# Patient Record
Sex: Male | Born: 1955 | Race: Black or African American | Hispanic: No | Marital: Single | Smoking: Never smoker
Health system: Southern US, Community
[De-identification: ages and names within clinical notes are randomized; demographics above are authoritative.]

## PROBLEM LIST (undated history)

## (undated) DIAGNOSIS — I10 Essential (primary) hypertension: Secondary | ICD-10-CM

## (undated) HISTORY — PX: APPENDECTOMY: SHX54

---

## 2018-01-24 ENCOUNTER — Emergency Department (HOSPITAL_COMMUNITY): Payer: BLUE CROSS/BLUE SHIELD

## 2018-01-24 ENCOUNTER — Emergency Department (HOSPITAL_COMMUNITY)
Admission: EM | Admit: 2018-01-24 | Discharge: 2018-01-24 | Disposition: A | Discharge status: Home / Self Care | Payer: BLUE CROSS/BLUE SHIELD | Attending: Emergency Medicine | Admitting: Emergency Medicine

## 2018-01-24 ENCOUNTER — Encounter (HOSPITAL_COMMUNITY): Payer: Self-pay | Admitting: *Deleted

## 2018-01-24 DIAGNOSIS — Z79899 Other long term (current) drug therapy: Secondary | ICD-10-CM | POA: Diagnosis not present

## 2018-01-24 DIAGNOSIS — R0602 Shortness of breath: Secondary | ICD-10-CM | POA: Diagnosis not present

## 2018-01-24 DIAGNOSIS — R911 Solitary pulmonary nodule: Secondary | ICD-10-CM | POA: Insufficient documentation

## 2018-01-24 DIAGNOSIS — Z87891 Personal history of nicotine dependence: Secondary | ICD-10-CM | POA: Diagnosis not present

## 2018-01-24 DIAGNOSIS — I10 Essential (primary) hypertension: Secondary | ICD-10-CM | POA: Insufficient documentation

## 2018-01-24 DIAGNOSIS — R079 Chest pain, unspecified: Secondary | ICD-10-CM | POA: Diagnosis not present

## 2018-01-24 HISTORY — DX: Essential (primary) hypertension: I10

## 2018-01-24 LAB — CBC
HCT: 40.3 % (ref 39.0–52.0)
Hemoglobin: 13.6 g/dL (ref 13.0–17.0)
MCH: 27.7 pg (ref 26.0–34.0)
MCHC: 33.7 g/dL (ref 30.0–36.0)
MCV: 82.1 fL (ref 78.0–100.0)
PLATELETS: 193 10*3/uL (ref 150–400)
RBC: 4.91 MIL/uL (ref 4.22–5.81)
RDW: 15.7 % — AB (ref 11.5–15.5)
WBC: 3.4 10*3/uL — ABNORMAL LOW (ref 4.0–10.5)

## 2018-01-24 LAB — BASIC METABOLIC PANEL
ANION GAP: 8 (ref 5–15)
BUN: 13 mg/dL (ref 6–20)
CALCIUM: 8.3 mg/dL — AB (ref 8.9–10.3)
CHLORIDE: 110 mmol/L (ref 98–111)
CO2: 25 mmol/L (ref 22–32)
CREATININE: 1.12 mg/dL (ref 0.61–1.24)
GFR calc Af Amer: >60 mL/min (ref >60)
GFR calc non Af Amer: >60 mL/min (ref >60)
GLUCOSE: 100 mg/dL — AB (ref 70–99)
POTASSIUM: 3.9 mmol/L (ref 3.5–5.1)
Sodium: 143 mmol/L (ref 135–145)

## 2018-01-24 LAB — D-DIMER, QUANTITATIVE: D-Dimer, Quant: 1.65 ug/mL-FEU — ABNORMAL HIGH (ref 0.00–0.50)

## 2018-01-24 LAB — I-STAT TROPONIN, ED
TROPONIN I, POC: 0.03 ng/mL (ref 0.00–0.08)
Troponin i, poc: 0.02 ng/mL (ref 0.00–0.08)

## 2018-01-24 MED ORDER — ASPIRIN 81 MG PO CHEW
324.0000 mg | CHEWABLE_TABLET | Freq: Once | ORAL | Status: AC
  Administered 2018-01-24: 324 mg via ORAL
  Filled 2018-01-24: qty 4

## 2018-01-24 MED ORDER — IOPAMIDOL (ISOVUE-370) INJECTION 76%
INTRAVENOUS | Status: AC
  Filled 2018-01-24: qty 100

## 2018-01-24 MED ORDER — IOPAMIDOL (ISOVUE-370) INJECTION 76%
100.0000 mL | Freq: Once | INTRAVENOUS | Status: AC | PRN
  Administered 2018-01-24: 100 mL via INTRAVENOUS

## 2018-01-24 NOTE — Discharge Instructions (Addendum)
2. Nodular opacities on the right, largest measuring 7 mm.  Non-contrast chest CT at 3-6 months is recommended. If the nodules  are stable at time of repeat CT, then future CT at 18-24 months  (from today's scan) is considered optional for low-risk patients,  but is recommended for high-risk patients. This recommendation  follows the consensus statement: Guidelines for Management of  Incidental Pulmonary Nodules Detected on CT Images: From the  Fleischner Society 2017; Radiology 2017; 284:228-243.  You were evaluated in the emergency department for some chest pain that radiated into your left shoulder.  You had an EKG blood work chest x-ray and CAT scan of your chest that did not find an obvious cause of your pain.  This is possibly musculoskeletal and should improve with time.  Please follow-up with your doctor and return if any worsening symptoms.

## 2018-01-24 NOTE — ED Triage Notes (Signed)
Pt woke up with chest pain radiating to left arm and shortness of breath this morning. Pt went to work, states pain did not get better.

## 2018-01-24 NOTE — ED Notes (Signed)
XR at bedside

## 2018-01-24 NOTE — ED Provider Notes (Signed)
[facility]-EMERGENCY DEPT Provider Note   CSN: [id number] Arrival date & time: 01/24/18  0807     History   Chief Complaint Chief Complaint  Patient presents with  . Chest Pain    HPI Curtis Vaughn is a 62 y.o. male.  He has a history of hypertension.  He is a former smoker.  No prior coronary artery disease.  He is complaining of left sided chest pain sharp in nature that radiated to his left shoulder this morning it sounds like it lasts for seconds and has occurred multiple times.  Causes him to feel short of breath.  States over the past year or so he has had increased shortness of breath with exertion.  He does not recall any trauma.  He does a lot of physical activity for his job.  He denies fevers chills cough nausea vomiting diaphoresis.  No leg pain or leg swelling.  Denies any cocaine.  The history is provided by the patient.  Chest Pain   This is a new problem. The current episode started 1 to 2 hours ago. The problem has not changed since onset.The pain is associated with movement and breathing. The pain is present in the substernal region. The pain is moderate. The quality of the pain is described as sharp and stabbing. The pain radiates to the left shoulder. The symptoms are aggravated by deep breathing and certain positions. Pertinent negatives include no abdominal pain, no cough, no diaphoresis, no dizziness, no fever, no headaches, no hemoptysis, no leg pain, no lower extremity edema, no nausea, no shortness of breath and no vomiting. He has tried nothing for the symptoms. The treatment provided no relief.  His past medical history is significant for hypertension.    Past Medical History:  Diagnosis Date  . Hypertension     There are no active problems to display for this patient.   Past Surgical History:  Procedure Laterality Date  . APPENDECTOMY          Home Medications    Prior to Admission medications   Medication Sig Start  Date End Date Taking? Authorizing Provider  lisinopril (PRINIVIL,ZESTRIL) 20 MG tablet Take 20 mg by mouth daily.   Yes [provider]  Multiple Vitamin (MULTIVITAMIN WITH MINERALS) TABS tablet Take 1 tablet by mouth daily.   Yes [provider]    Family History No family history on file.  Social History Social History   Tobacco Use  . Smoking status: Never Smoker  . Smokeless tobacco: Never Used  Substance Use Topics  . Alcohol use: Yes  . Drug use: Not on file     Allergies   Patient has no allergy information on record.   Review of Systems Review of Systems  Constitutional: Negative for diaphoresis and fever.  HENT: Negative for sore throat.   Eyes: Negative for visual disturbance.  Respiratory: Negative for cough, hemoptysis and shortness of breath.   Cardiovascular: Positive for chest pain.  Gastrointestinal: Negative for abdominal pain, nausea and vomiting.  Genitourinary: Negative for dysuria.  Musculoskeletal: Negative for neck pain.  Skin: Negative for rash.  Neurological: Negative for dizziness and headaches.     Physical Exam Updated Vital Signs BP (!) 153/96 (BP Location: Left Arm)   Pulse 62   Temp 98 F (36.7 C)   Resp 16   SpO2 99%   Physical Exam  Constitutional: He appears well-developed and well-nourished.  HENT:  Head: Normocephalic and atraumatic.  Eyes: Conjunctivae are normal.  Neck: Neck supple.  Cardiovascular: Normal rate, regular rhythm and normal pulses.  No murmur heard. Pulmonary/Chest: Effort normal and breath sounds normal. No respiratory distress.  Abdominal: Soft. There is no tenderness.  Musculoskeletal: Normal range of motion. He exhibits no edema.       Right lower leg: He exhibits no tenderness.       Left lower leg: He exhibits no tenderness.  Neurological: He is alert.  Skin: Skin is warm and dry. Capillary refill takes less than 2 seconds.  Psychiatric: He has a normal mood and affect.  Nursing  note and vitals reviewed.    ED Treatments / Results  Labs (all labs ordered are listed, but only abnormal results are displayed) Labs Reviewed  BASIC METABOLIC PANEL - Abnormal; Notable for the following components:      Result Value   Glucose, Bld 100 (*)    Calcium 8.3 (*)    All other components within normal limits  CBC - Abnormal; Notable for the following components:   WBC 3.4 (*)    RDW 15.7 (*)    All other components within normal limits  D-DIMER, QUANTITATIVE (NOT AT [facility]) - Abnormal; Notable for the following components:   D-Dimer, Quant 1.65 (*)    All other components within normal limits  I-STAT TROPONIN, ED  I-STAT TROPONIN, ED    EKG EKG Interpretation  Date/Time:  Thursday January 24 2018 08:18:13 EDT Ventricular Rate:  67 PR Interval:    QRS Duration: 73 QT Interval:  396 QTC Calculation: 418 R Axis:   61 Text Interpretation:  Sinus rhythm Abnormal R-wave progression, early transition no prior to compare with Confirmed by Meridee Score [phone]) on 01/24/2018 9:09:08 AM   Radiology Ct Angio Chest Pe W/cm &/or Wo Cm  Result Date: 01/24/2018 CLINICAL DATA:  Shortness of breath and chest pain EXAM: CT ANGIOGRAPHY CHEST WITH CONTRAST TECHNIQUE: Multidetector CT imaging of the chest was performed using the standard protocol during bolus administration of intravenous contrast. Multiplanar CT image reconstructions and MIPs were obtained to evaluate the vascular anatomy. CONTRAST:  ISOVUE-370 IOPAMIDOL (ISOVUE-370) INJECTION 76% COMPARISON:  January 24, 2018 chest radiograph FINDINGS: Cardiovascular: There is no evident pulmonary embolus. There is no thoracic aortic aneurysm. No dissection is evident. It should be noted that the contrast bolus in the aorta is less than optimal for dissection assessment. Visualized great vessels appear unremarkable. There is no appreciable pericardial effusion or pericardial thickening. Mediastinum/Nodes: Thyroid appears  normal. There is no appreciable thoracic adenopathy. There is a small hiatal hernia. Lungs/Pleura: There is no appreciable edema or consolidation. On axial slice 93 series 6, there is a 7 x 5 x 5 mm nodular opacity in the lateral segment of the right middle lobe, also seen on coronal slice 70 series 7 and sagittal slice 50 series 8. On axial slice 86 series 6, there is an 8 x 5 mm nodular opacity in the anterior segment of the right lower lobe, also appreciable on coronal slice 80 series 7 and sagittal slice 33 series 8. There is a nodular opacity in the anterior segment right lower lobe measuring 7 x 7 x 7 mm seen on axial slice 88 series 6 as well as on coronal slice 84 series 7 and sagittal slice 42 series 8. No evident pleural effusion or pleural thickening. Upper Abdomen: Visualized upper abdominal structures appear normal except for reflux of contrast into the inferior vena cava and hepatic veins. Musculoskeletal: There are no blastic or lytic bone  lesions. There is mild degenerative change in the thoracic spine. No chest wall lesions are evident. Review of the MIP images confirms the above findings. IMPRESSION: 1. No demonstrable pulmonary embolus. No thoracic aortic aneurysm. No dissection evident, although contrast bolus is less than optimal for dissection assessment. 2. Nodular opacities on the right, largest measuring 7 mm. Non-contrast chest CT at 3-6 months is recommended. If the nodules are stable at time of repeat CT, then future CT at 18-24 months (from today's scan) is considered optional for low-risk patients, but is recommended for high-risk patients. This recommendation follows the consensus statement: Guidelines for Management of Incidental Pulmonary Nodules Detected on CT Images: From the Fleischner Society 2017; Radiology 2017; 284:228-243. 3.  No lung edema or consolidation. 4. Reflux of contrast into the inferior vena cava and hepatic veins may indicate a degree of increase in right heart  pressure. 5.  Small hiatal hernia. 6.  No evident thoracic adenopathy. Electronically Signed   By: Bretta Bang III M.D.   On: 01/24/2018 10:11   Dg Chest Port 1 View  Result Date: 01/24/2018 CLINICAL DATA:  Chest pain. EXAM: PORTABLE CHEST 1 VIEW COMPARISON:  None. FINDINGS: The heart size and mediastinal contours are within normal limits. Both lungs are clear. The visualized skeletal structures are unremarkable. IMPRESSION: No active disease. Electronically Signed   By: Signa Kell M.D.   On: 01/24/2018 09:01    Procedures Procedures (including critical care time)  Medications Ordered in ED Medications  aspirin chewable tablet 324 mg (has no administration in time range)     Initial Impression / Assessment and Plan / ED Course  I have reviewed the triage vital signs and the nursing notes.  Pertinent labs & imaging results that were available during my care of the patient were reviewed by me and considered in my medical decision making (see chart for details).  Clinical Course as of Jan 25 1912  Thu Jan 24, 2018  0830 ECG normal sinus rhythm rate of 67 normal intervals no acute ST-T changes, no prior to compare with.   [MB]  1049 Patient's work-up so far has been unremarkable with a negative troponin negative chest x-ray.  His d-dimer is elevated at 1.65 and by CT he has no evidence of PE.  They did find an into the incidental nodule.  Of updated the patient on this information and we are getting a second troponin.  He understands that is negative he likely will be discharged to follow-up outpatient.   [MB]  1141 Delta troponin negative.  Chest pain likely musculoskeletal and he is given good return instructions.   [MB]    Clinical Course User Index [MB] Terrilee Files, MD     Final Clinical Impressions(s) / ED Diagnoses   Final diagnoses:  Nonspecific chest pain  Nodule of right lung    ED Discharge Orders    None       Terrilee Files, MD 01/24/18  [phone]

## 2018-01-24 NOTE — ED Notes (Signed)
ED Provider at bedside. 

## 2018-01-24 NOTE — ED Notes (Signed)
Patient transported to CT 

## 2019-06-08 IMAGING — CT CT ANGIO CHEST
2 of 6 series · 17 of 36 positions shown · IV contrast (iopamidol)
Comparison: January 24, 2018 chest radiograph

CLINICAL DATA: Shortness of breath and chest pain

EXAM:
CT ANGIOGRAPHY CHEST WITH CONTRAST
TECHNIQUE: Multidetector CT imaging of the chest was performed using the
standard protocol during bolus administration of intravenous
contrast. Multiplanar CT image reconstructions and MIPs were
obtained to evaluate the vascular anatomy.
CONTRAST:  100mL TW94R8-40P IOPAMIDOL (TW94R8-40P) INJECTION 76%

[Series 5: thins · axial · 0.73mm/px · z∈[-310,-62]mm · 16 of 276 slices shown]
[im 14/276  lung]
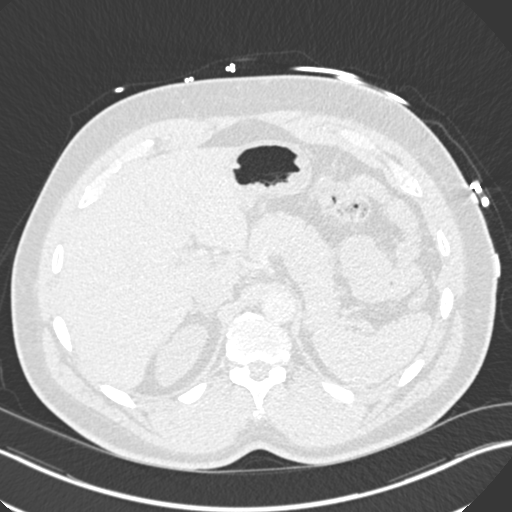
[im 28/276  mediastinal]
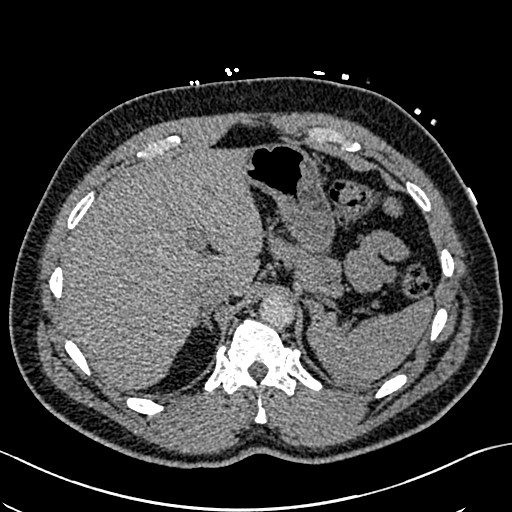
[im 42/276  lung]
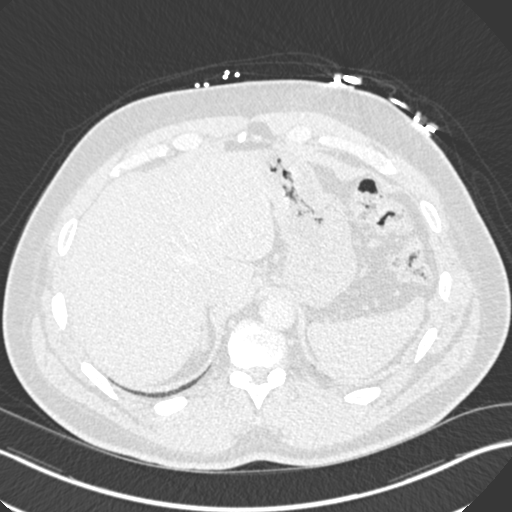
[im 69/276  mediastinal]
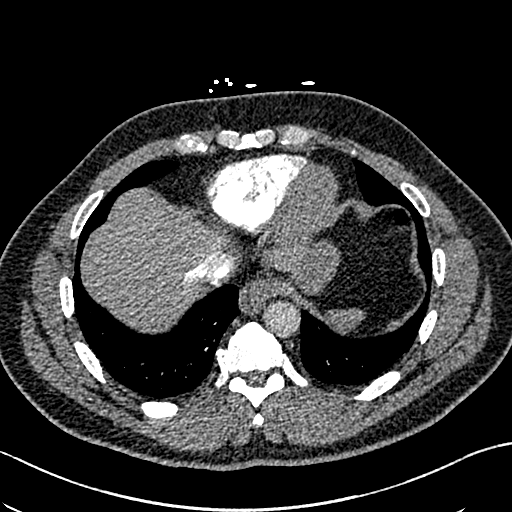
[im 83/276  lung]
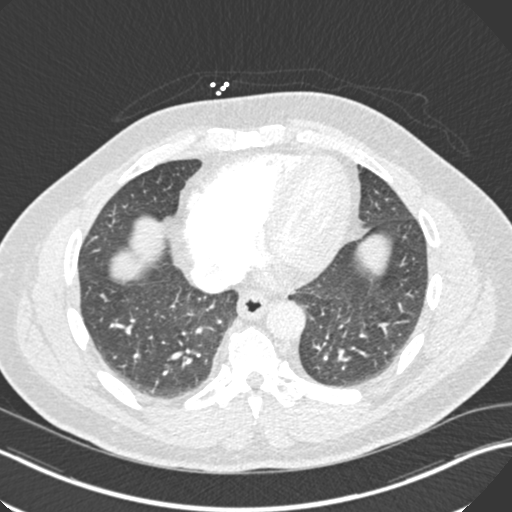
[im 97/276  mediastinal]
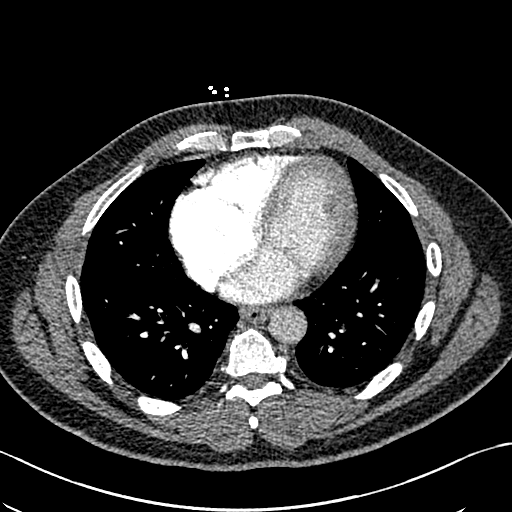
[im 111/276  lung]
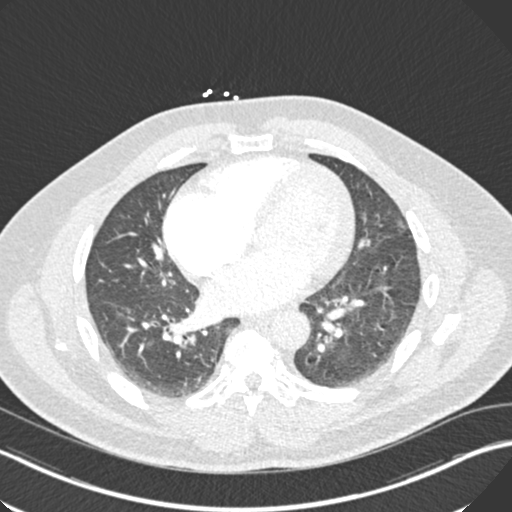
[im 124/276  mediastinal]
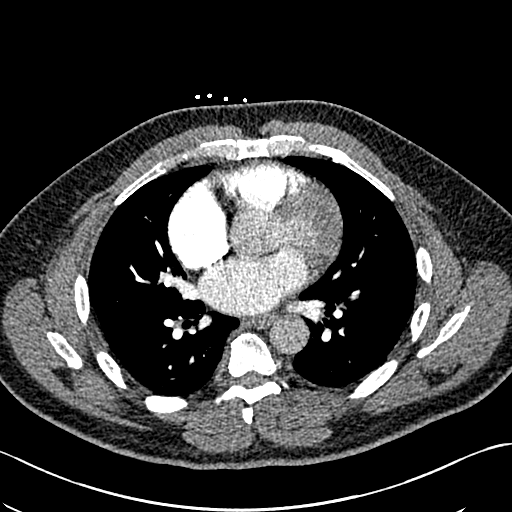
[im 152/276  lung]
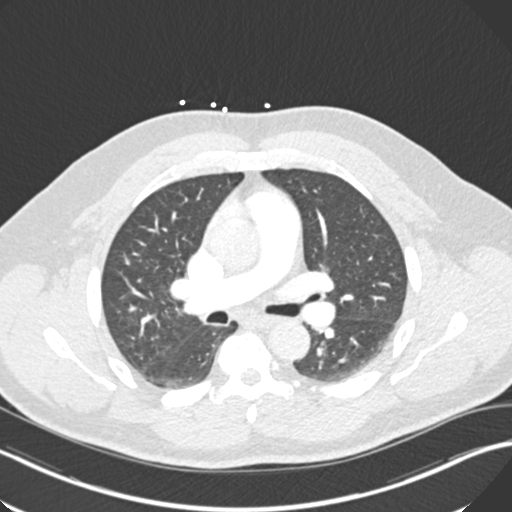
[im 166/276  mediastinal]
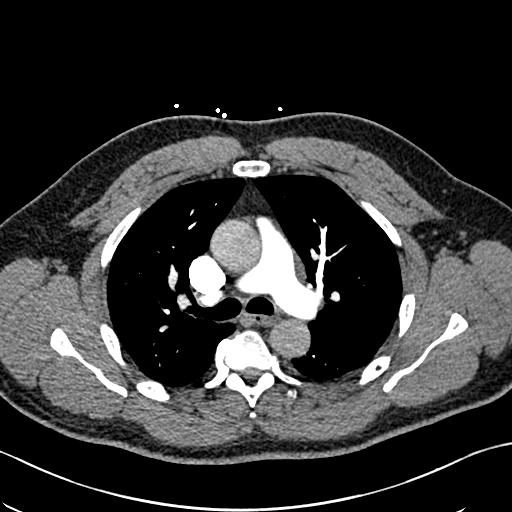
[im 179/276  lung]
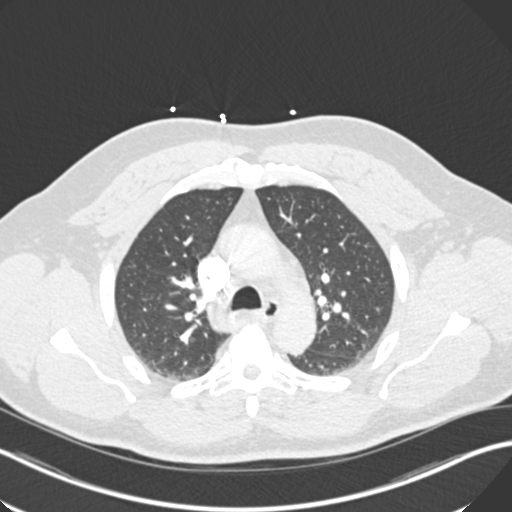
[im 193/276  mediastinal]
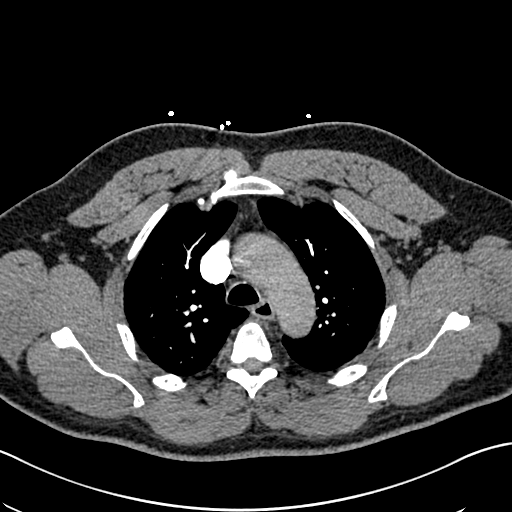
[im 207/276  lung]
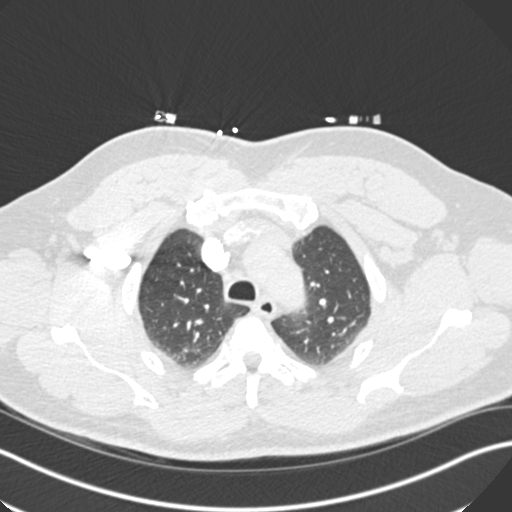
[im 234/276  mediastinal]
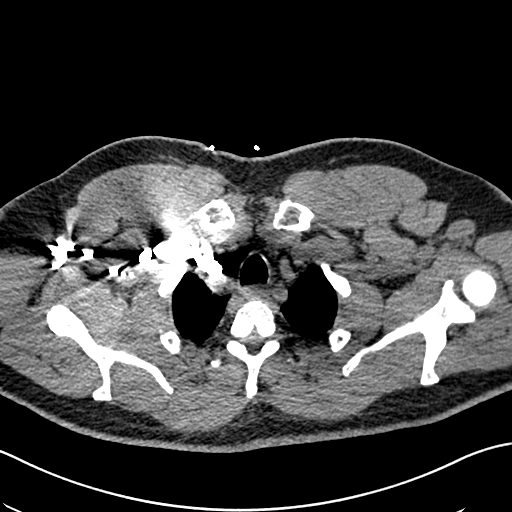
[im 248/276  lung]
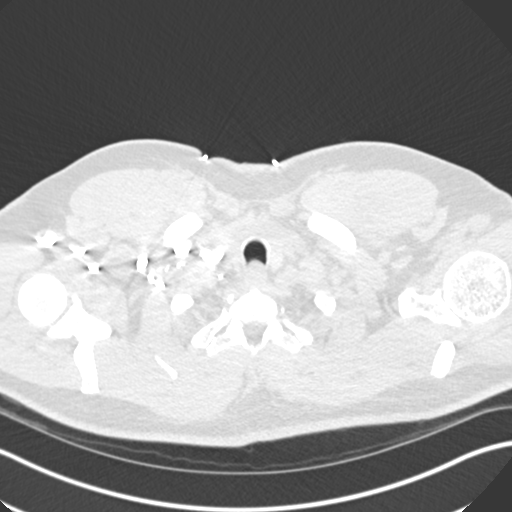
[im 262/276  mediastinal]
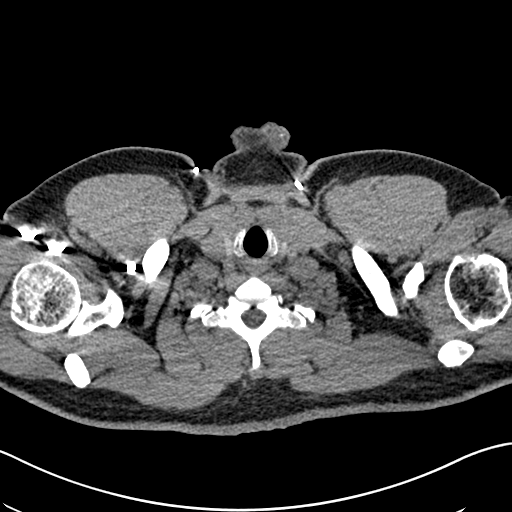

[Series 7: coronal mpr · coronal · 0.58mm/px · 1 of 140 slices shown]
[im 70/140  mediastinal]
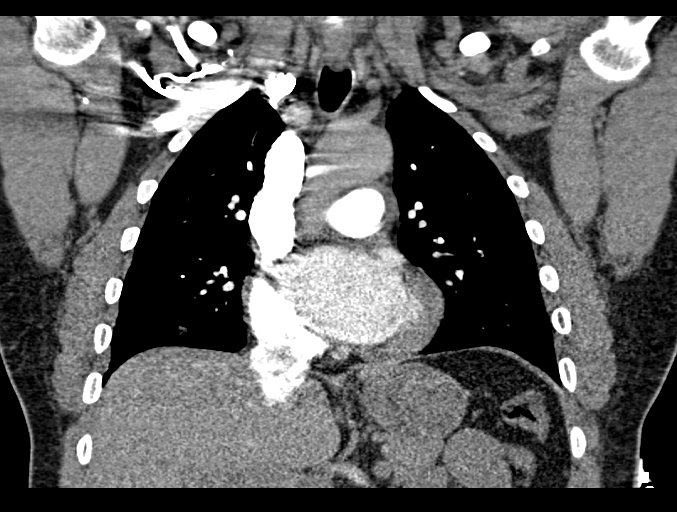

[17 of 36 positions shown; findings below may reference images not displayed]

FINDINGS: Cardiovascular: There is no evident pulmonary embolus. There is no
thoracic aortic aneurysm. No dissection is evident. It should be
noted that the contrast bolus in the aorta is less than optimal for
dissection assessment. Visualized great vessels appear unremarkable.
There is no appreciable pericardial effusion or pericardial
thickening.

Mediastinum/Nodes: Thyroid appears normal. There is no appreciable
thoracic adenopathy. There is a small hiatal hernia.

Lungs/Pleura: There is no appreciable edema or consolidation. On
axial slice 93 series 6, there is a 7 x 5 x 5 mm nodular opacity in
the lateral segment of the right middle lobe, also seen on coronal
slice 70 series 7 and sagittal slice 50 series 8. On axial slice 86
series 6, there is an 8 x 5 mm nodular opacity in the anterior
segment of the right lower lobe, also appreciable on coronal slice
80 series 7 and sagittal slice 33 series 8. There is a nodular
opacity in the anterior segment right lower lobe measuring 7 x 7 x 7
mm seen on axial slice 88 series 6 as well as on coronal slice 84
series 7 and sagittal slice 42 series 8. No evident pleural effusion
or pleural thickening.

Upper Abdomen: Visualized upper abdominal structures appear normal
except for reflux of contrast into the inferior vena cava and
hepatic veins.

Musculoskeletal: There are no blastic or lytic bone lesions. There
is mild degenerative change in the thoracic spine. No chest wall
lesions are evident.

Review of the MIP images confirms the above findings.
IMPRESSION: 1. No demonstrable pulmonary embolus. No thoracic aortic aneurysm.
No dissection evident, although contrast bolus is less than optimal
for dissection assessment.

2. Nodular opacities on the right, largest measuring 7 mm.
Non-contrast chest CT at 3-6 months is recommended. If the nodules
are stable at time of repeat CT, then future CT at 18-24 months
(from today's scan) is considered optional for low-risk patients,
but is recommended for high-risk patients. This recommendation
follows the consensus statement: Guidelines for Management of
Incidental Pulmonary Nodules Detected on CT Images: From the

3.  No lung edema or consolidation.

4. Reflux of contrast into the inferior vena cava and hepatic veins
may indicate a degree of increase in right heart pressure.

5.  Small hiatal hernia.

6.  No evident thoracic adenopathy.
